# Patient Record
Sex: Male | Born: 1970 | Race: Black or African American | Hispanic: No | Marital: Married | Smoking: Never smoker
Health system: Southern US, Community
[De-identification: ages and names within clinical notes are randomized; demographics above are authoritative.]

---

## 2017-04-10 ENCOUNTER — Ambulatory Visit (HOSPITAL_COMMUNITY)
Admission: EM | Admit: 2017-04-10 | Discharge: 2017-04-10 | Disposition: A | Discharge status: Nursing Home (Includes ICF) | Payer: TRICARE For Life (TFL) | Attending: Internal Medicine | Admitting: Internal Medicine

## 2017-04-10 ENCOUNTER — Encounter (HOSPITAL_COMMUNITY): Payer: Self-pay | Admitting: *Deleted

## 2017-04-10 ENCOUNTER — Emergency Department (HOSPITAL_COMMUNITY)
Admission: EM | Admit: 2017-04-10 | Discharge: 2017-04-10 | Disposition: A | Discharge status: Home / Self Care | Attending: Emergency Medicine | Admitting: Emergency Medicine

## 2017-04-10 ENCOUNTER — Encounter (HOSPITAL_COMMUNITY): Payer: Self-pay | Admitting: Emergency Medicine

## 2017-04-10 ENCOUNTER — Emergency Department (HOSPITAL_COMMUNITY)

## 2017-04-10 DIAGNOSIS — W540XXA Bitten by dog, initial encounter: Secondary | ICD-10-CM | POA: Insufficient documentation

## 2017-04-10 DIAGNOSIS — Y998 Other external cause status: Secondary | ICD-10-CM | POA: Insufficient documentation

## 2017-04-10 DIAGNOSIS — Y92009 Unspecified place in unspecified non-institutional (private) residence as the place of occurrence of the external cause: Secondary | ICD-10-CM | POA: Insufficient documentation

## 2017-04-10 DIAGNOSIS — S71151A Open bite, right thigh, initial encounter: Secondary | ICD-10-CM

## 2017-04-10 DIAGNOSIS — Y9389 Activity, other specified: Secondary | ICD-10-CM | POA: Diagnosis not present

## 2017-04-10 DIAGNOSIS — S81851A Open bite, right lower leg, initial encounter: Secondary | ICD-10-CM

## 2017-04-10 DIAGNOSIS — Z23 Encounter for immunization: Secondary | ICD-10-CM | POA: Diagnosis not present

## 2017-04-10 DIAGNOSIS — S0181XA Laceration without foreign body of other part of head, initial encounter: Secondary | ICD-10-CM | POA: Diagnosis present

## 2017-04-10 DIAGNOSIS — S0185XA Open bite of other part of head, initial encounter: Secondary | ICD-10-CM

## 2017-04-10 DIAGNOSIS — S81812A Laceration without foreign body, left lower leg, initial encounter: Secondary | ICD-10-CM | POA: Insufficient documentation

## 2017-04-10 DIAGNOSIS — S81852A Open bite, left lower leg, initial encounter: Secondary | ICD-10-CM

## 2017-04-10 DIAGNOSIS — S41151A Open bite of right upper arm, initial encounter: Secondary | ICD-10-CM

## 2017-04-10 DIAGNOSIS — S41152A Open bite of left upper arm, initial encounter: Secondary | ICD-10-CM

## 2017-04-10 MED ORDER — HYDROCODONE-ACETAMINOPHEN 5-325 MG PO TABS
ORAL_TABLET | ORAL | Status: AC
  Filled 2017-04-10: qty 1

## 2017-04-10 MED ORDER — CHLORHEXIDINE GLUCONATE 4 % EX LIQD
4.0000 "application " | Freq: Once | CUTANEOUS | Status: AC
  Administered 2017-04-10: 4 via TOPICAL
  Filled 2017-04-10: qty 60

## 2017-04-10 MED ORDER — SODIUM CHLORIDE 0.9 % IV SOLN
3.0000 g | Freq: Once | INTRAVENOUS | Status: AC
  Administered 2017-04-10: 3 g via INTRAVENOUS
  Filled 2017-04-10: qty 3

## 2017-04-10 MED ORDER — TETANUS-DIPHTH-ACELL PERTUSSIS 5-2.5-18.5 LF-MCG/0.5 IM SUSP
0.5000 mL | Freq: Once | INTRAMUSCULAR | Status: AC
  Administered 2017-04-10: 0.5 mL via INTRAMUSCULAR
  Filled 2017-04-10: qty 0.5

## 2017-04-10 MED ORDER — HYDROCODONE-ACETAMINOPHEN 5-325 MG PO TABS
1.0000 | ORAL_TABLET | Freq: Once | ORAL | Status: AC
  Administered 2017-04-10: 1 via ORAL

## 2017-04-10 MED ORDER — LIDOCAINE HCL 2 % IJ SOLN
20.0000 mL | Freq: Once | INTRAMUSCULAR | Status: AC
  Administered 2017-04-10: 400 mg
  Filled 2017-04-10: qty 20

## 2017-04-10 MED ORDER — AMOXICILLIN-POT CLAVULANATE 875-125 MG PO TABS: 1.0000 | ORAL_TABLET | Freq: Two times a day (BID) | ORAL | 0 refills | Status: AC

## 2017-04-10 NOTE — ED Notes (Signed)
Pt taken to decon room and RUE, LUE, RLE, LLE wounds washed out with hibaclens and rinsed well. Pt tolerated well.

## 2017-04-10 NOTE — ED Provider Notes (Signed)
[facility]-EMERGENCY DEPT Provider Note   CSN: [id number] Arrival date & time: 04/10/17  1217     History   Chief Complaint Chief Complaint  Darren Campbell presents with  . Animal Bite    HPI Darren Campbell is a 46 y.o. male.  HPI   46 year old male presents status post animal bite.  Darren Campbell reports he is visiting a friend from [place].  He notes the friend has a boxer who normally is very friendly.  He reports he was attempting to keep the boxer in the house from going outside when he was attacked.  He notes bites to his bilateral lower extremities bilateral upper extremities and face.  He notes his last tetanus shot was over 5 years ago.  The owner of the dog is present throughout evaluation reports the dog's vaccinations are up-to-date, but they are watching the dog for the recommended quarantine.  No prior abnormal behavior prior to this event.  Darren Campbell was seen in urgent care, given hydrocodone and sent over the emergency room for further evaluation.   History reviewed. No pertinent past medical history.  There are no active problems to display for this Darren Campbell.   History reviewed. No pertinent surgical history.     Home Medications    Prior to Admission medications   Medication Sig Start Date End Date Taking? Authorizing Provider  amoxicillin-clavulanate (AUGMENTIN) 875-125 MG tablet Take 1 tablet by mouth every 12 (twelve) hours. 04/10/17   Eyvonne MechanicHedges, Shanard Treto, PA-C    Family History No family history on file.  Social History Social History  Substance Use Topics  . Smoking status: Never Smoker  . Smokeless tobacco: Never Used  . Alcohol use Yes    Allergies   Darren Campbell has no known allergies.   Review of Systems Review of Systems  All other systems reviewed and are negative.  Physical Exam Updated Vital Signs BP (!) 146/110 (BP Location: Right Arm)   Pulse 93   Temp 98.8 F (37.1 C) (Oral)   Resp 16   Ht 5\' 10"  (1.778 m)   Wt 74.8 kg (165 lb)   SpO2 100%   BMI  23.68 kg/m   Physical Exam  Constitutional: He is oriented to person, place, and time. He appears well-developed and well-nourished.  HENT:  Head: Normocephalic and atraumatic.  2 cm laceration / 1.5 cm laceration to left cheek  Eyes: Conjunctivae are normal. Pupils are equal, round, and reactive to light. Right eye exhibits no discharge. Left eye exhibits no discharge. No scleral icterus.  Neck: Normal range of motion. No JVD present. No tracheal deviation present.  Pulmonary/Chest: Effort normal. No stridor.  Musculoskeletal:  RUE- numerous punctures to left forearm, no foreign bodies  REE- superficial laceration to right forearm  RLE- punctures to left foot on both plantar and dorsal aspect  LLE- laceration of distal medial leg ( 1.5 cm)   Neurological: He is alert and oriented to person, place, and time. Coordination normal.  Psychiatric: He has a normal mood and affect. His behavior is normal. Judgment and thought content normal.  Nursing note and vitals reviewed.    ED Treatments / Results  Labs (all labs ordered are listed, but only abnormal results are displayed) Labs Reviewed - No data to display  EKG  EKG Interpretation None       Radiology Dg Forearm Left  Result Date: 04/10/2017 CLINICAL DATA:  Multiple dog bites today, initial encounter EXAM: LEFT FOREARM - 2 VIEW COMPARISON:  None. FINDINGS: Lucencies are noted in the  soft tissues consistent with the recent injury. No underlying bony abnormality is seen. IMPRESSION: Soft tissue injury without acute bony abnormality. Electronically Signed   By: Alcide Clever M.D.   On: 04/10/2017 15:09   Dg Foot Complete Right  Result Date: 04/10/2017 CLINICAL DATA:  Dog bite distal leg. EXAM: RIGHT FOOT COMPLETE - 3+ VIEW COMPARISON:  None. FINDINGS: Three views study shows a linear lucency through the base of the great toe proximal phalanx, with extension to the articular surface. This is visible on only 1 of the 3 images.  There does appear to be some gas in the soft tissues of this region. No retained radiopaque soft tissue foreign body. No other evidence of an acute fracture. No subluxation or dislocation. IMPRESSION: Findings suspicious for nondisplaced fracture involving the base of the great toe proximal phalanx. Gas in the overlying soft tissues may be related to puncture wound and raises the question of open injury. Electronically Signed   By: Kennith Center M.D.   On: 04/10/2017 15:10    Procedures .Marland KitchenLaceration Repair Date/Time: 04/10/2017 4:55 PM Performed by: Curlene Dolphin, Gunhild Bautch Authorized by: Curlene Dolphin, Manoah Deckard   Consent:    Consent obtained:  Verbal   Consent given by:  Darren Campbell   Risks discussed:  Infection, pain, poor cosmetic result and poor wound healing   Alternatives discussed:  No treatment, delayed treatment, observation and referral Anesthesia (see MAR for exact dosages):    Anesthesia method:  Local infiltration   Local anesthetic:  Lidocaine 1% w/o epi Laceration details:    Location:  Leg   Leg location:  L lower leg   Length (cm):  1.5 Repair type:    Repair type:  Simple Pre-procedure details:    Preparation:  Darren Campbell was prepped and draped in usual sterile fashion and imaging obtained to evaluate for foreign bodies Exploration:    Hemostasis achieved with:  Direct pressure   Wound exploration: wound explored through full range of motion and entire depth of wound probed and visualized     Wound extent: no fascia violation noted, no foreign bodies/material noted, no muscle damage noted, no nerve damage noted, no tendon damage noted, no underlying fracture noted and no vascular damage noted     Contaminated: no   Treatment:    Area cleansed with:  Saline, soap and water and Hibiclens   Amount of cleaning:  Extensive   Irrigation solution:  Sterile saline   Irrigation method:  Syringe   Visualized foreign bodies/material removed: no   Skin repair:    Repair method:  Sutures   Suture  size:  5-0   Suture material:  Prolene   Suture technique:  Simple interrupted   Number of sutures:  3 Approximation:    Approximation:  Loose   Vermilion border: well-aligned   Post-procedure details:    Dressing:  Bulky dressing   Darren Campbell tolerance of procedure:  Tolerated well, no immediate complications .Marland KitchenLaceration Repair Date/Time: 04/10/2017 4:56 PM Performed by: Curlene Dolphin, Calvina Liptak Authorized by: Curlene Dolphin, Menelik Mcfarren   Consent:    Consent obtained:  Verbal   Consent given by:  Darren Campbell   Risks discussed:  Infection, pain, poor cosmetic result, poor wound healing, nerve damage, retained foreign body, tendon damage, vascular damage and need for additional repair   Alternatives discussed:  No treatment, delayed treatment, observation and referral Anesthesia (see MAR for exact dosages):    Anesthesia method:  Local infiltration   Local anesthetic:  Lidocaine 1% w/o epi Laceration details:    Location:  Face   Length (cm):  2 Repair type:    Repair type:  Simple Pre-procedure details:    Preparation:  Darren Campbell was prepped and draped in usual sterile fashion Exploration:    Hemostasis achieved with:  Direct pressure   Wound exploration: entire depth of wound probed and visualized     Wound extent: no areolar tissue violation noted, no fascia violation noted, no foreign bodies/material noted, no nerve damage noted, no tendon damage noted, no underlying fracture noted and no vascular damage noted     Contaminated: no   Treatment:    Area cleansed with:  Hibiclens and saline   Amount of cleaning:  Standard   Irrigation solution:  Sterile saline   Irrigation method:  Syringe   Visualized foreign bodies/material removed: no   Skin repair:    Repair method:  Sutures Approximation:    Approximation:  Close   Vermilion border: well-aligned   Post-procedure details:    Dressing:  Open (no dressing)   Darren Campbell tolerance of procedure:  Tolerated well, no immediate complications    (including critical care time)  Medications Ordered in ED Medications  chlorhexidine (HIBICLENS) 4 % liquid 4 application (4 application Topical Given 04/10/17 1422)  Tdap (BOOSTRIX) injection 0.5 mL (0.5 mLs Intramuscular Given 04/10/17 1421)  lidocaine (XYLOCAINE) 2 % (with pres) injection 400 mg (400 mg Infiltration Given 04/10/17 1422)  Ampicillin-Sulbactam (UNASYN) 3 g in sodium chloride 0.9 % 100 mL IVPB (0 g Intravenous Stopped 04/10/17 1556)     Initial Impression / Assessment and Plan / ED Course  I have reviewed the triage vital signs and the nursing notes.  Pertinent labs & imaging results that were available during my care of the Darren Campbell were reviewed by me and considered in my medical decision making (see chart for details).      Final Clinical Impressions(s) / ED Diagnoses   Final diagnoses:  Dog bite, initial encounter  Facial laceration, initial encounter  Laceration of left lower extremity, initial encounter    Labs: DG foot complete right, DG forearm left  Imaging: DG forearm left DG foot complete right  Consults:  Therapeutics: Chlorhexidine, Tdap, lidocaine, Unasyn  Discharge Meds:   Assessment/Plan: 45 year old healthy male presents today with numerous animal bites.  He has numerous punctures and lacerations.  Darren Campbell has lacerations to his face that were thoroughly irrigated and repaired here.  Darren Campbell also has punctures to the bilateral upper extremities with no significant repairable lacerations.  Darren Campbell has a laceration to his left lower leg that was amenable to I&D after copious irrigation.  Darren Campbell was placed under the sink with all wounds thoroughly irrigated with soap and water and Hibiclens.  After this I personally irrigated his wounds.  He had no signs of clinical infection.  Darren Campbell does have what appears to be a fracture on the toe, he has no overlying laceration but does have a puncture on the plantar aspect of the foot.  I discussed with the Darren Campbell  the high risk of infection and do not need for close follow-up as an outpatient.  Darren Campbell was given a dose of Unasyn here, he will be placed on Augmentin for home.  Darren Campbell will follow up with primary care for wound recheck and suture removal, extensively counseled him on return precautions.  Both him and his wife verbalized understanding and agreement to today's plan had no further questions or concerns at time discharge  Darren Campbell was bit by a dog with up-to-date vaccinations that will be quarantined,  no need for rabies therapy at this time.      New Prescriptions Discharge Medication List as of 04/10/2017  4:59 PM    START taking these medications   Details  amoxicillin-clavulanate (AUGMENTIN) 875-125 MG tablet Take 1 tablet by mouth every 12 (twelve) hours., Starting Tue 04/10/2017, Print         Amardeep Beckers, [place], PA-C 04/10/17 1743    Mancel Bale, MD 04/11/17 [phone]

## 2017-04-10 NOTE — ED Notes (Signed)
Pt. Transported to xray 

## 2017-04-10 NOTE — Discharge Instructions (Signed)
Please read attached information.  Please keep wounds clean and bandaged as directed.  Please take antibiotics as indicated.  Please monitor for signs of infection follow-up immediately in the emergency room if any signs of infection present.  Please follow-up with your primary care provider in 1 week for suture removal.  Please return if any new or worsening signs or symptoms present.

## 2017-04-10 NOTE — ED Provider Notes (Signed)
CSN: [id number]     Arrival date & time 04/10/17  1100 History   None    Chief Complaint  Patient presents with  . Animal Bite   (Consider location/radiation/quality/duration/timing/severity/associated sxs/prior Treatment) Patient c/o multiple dog bites on face, arms, legs, and right thigh.  He is in severe pain.   The history is provided by the patient.  Animal Bite  Contact animal:  Dog Location:  Head/neck, leg and shoulder/arm Shoulder/arm injury location:  L forearm and R forearm Leg injury location:  L lower leg, R lower leg and R upper leg Time since incident:  1 hour Pain details:    Quality:  Aching   Severity:  Severe   Timing:  Constant   Progression:  Worsening Incident location:  Another residence Provoked: provoked   Notifications:  None Animal's rabies vaccination status:  Up to date Animal in possession: yes   Tetanus status:  Out of date Relieved by:  None tried Worsened by:  Activity Ineffective treatments:  None tried   History reviewed. No pertinent past medical history. History reviewed. No pertinent surgical history. History reviewed. No pertinent family history. Social History  Substance Use Topics  . Smoking status: Never Smoker  . Smokeless tobacco: Never Used  . Alcohol use Yes    Review of Systems  Constitutional: Negative.   HENT: Negative.   Eyes: Negative.   Respiratory: Negative.   Cardiovascular: Negative.   Gastrointestinal: Negative.   Endocrine: Negative.   Genitourinary: Negative.   Musculoskeletal: Positive for arthralgias.  Skin: Positive for wound.  Allergic/Immunologic: Negative.   Neurological: Negative.   Hematological: Negative.   Psychiatric/Behavioral: Negative.     Allergies  Patient has no known allergies.  Home Medications   Prior to Admission medications   Not on File   Meds Ordered and Administered this Visit   Medications  HYDROcodone-acetaminophen (NORCO/VICODIN) 5-325 MG per tablet 1 tablet (1  tablet Oral Given 04/10/17 1210)    BP 112/80 (BP Location: Right Arm)   Pulse 95   Temp 98.1 F (36.7 C) (Oral)   Resp 18  No data found.   Physical Exam  Constitutional: He appears well-developed and well-nourished.  HENT:  Head: Normocephalic and atraumatic.  Eyes: Conjunctivae and EOM are normal. Pupils are equal, round, and reactive to light.  Neck: Normal range of motion. Neck supple.  Cardiovascular: Normal rate, regular rhythm and normal heart sounds.   Pulmonary/Chest: Effort normal and breath sounds normal.  Skin: There is erythema.  Left cheek with gaping laceration, left arm with puncture wound, right arm with puncture wound, right foot with puncture wound, left leg and foot with puncture wound Right thigh with puncture wound.  Nursing note and vitals reviewed.   Urgent Care Course     Procedures (including critical care time)  Labs Review Labs Reviewed - No data to display  Imaging Review No results found.   Visual Acuity Review  Right Eye Distance:   Left Eye Distance:   Bilateral Distance:    Right Eye Near:   Left Eye Near:    Bilateral Near:         MDM   1. Dog bite of face, initial encounter   2. Dog bite of left lower leg, initial encounter   3. Dog bite of right thigh, initial encounter   4. Dog bite of right upper extremity, initial encounter   5. Dog bite of left upper extremity, initial encounter   6. Dog bite of right lower  leg, initial encounter    Norco 5/325 one po now Patient advised to go to ED for higher level of care.      Deatra Canter[place], Taesha Goodell J, FNP 04/10/17 1231

## 2017-04-10 NOTE — Discharge Instructions (Signed)
Please go to ED

## 2017-04-10 NOTE — ED Triage Notes (Signed)
Bitten   By   A  Friends   Boxer   Pt    Blocked   The  Animals   Exit     From   House      Dog  Has  Had  Rabies  Vaccine       Pt  Has  Multiple  Bites

## 2017-04-10 NOTE — ED Triage Notes (Signed)
Pt. Stated, visiting his friend and the dog turned on him when letting hm out. Bite to face, both arms, right foot, and left leg. Dog has had his shots.

## 2018-03-04 IMAGING — DX DG FOOT COMPLETE 3+V*R*
3 series · 3 of 3 positions shown · non-contrast
Comparison: None.

CLINICAL DATA: Dog bite distal leg.

EXAM:
RIGHT FOOT COMPLETE - 3+ VIEW

[foot ap]
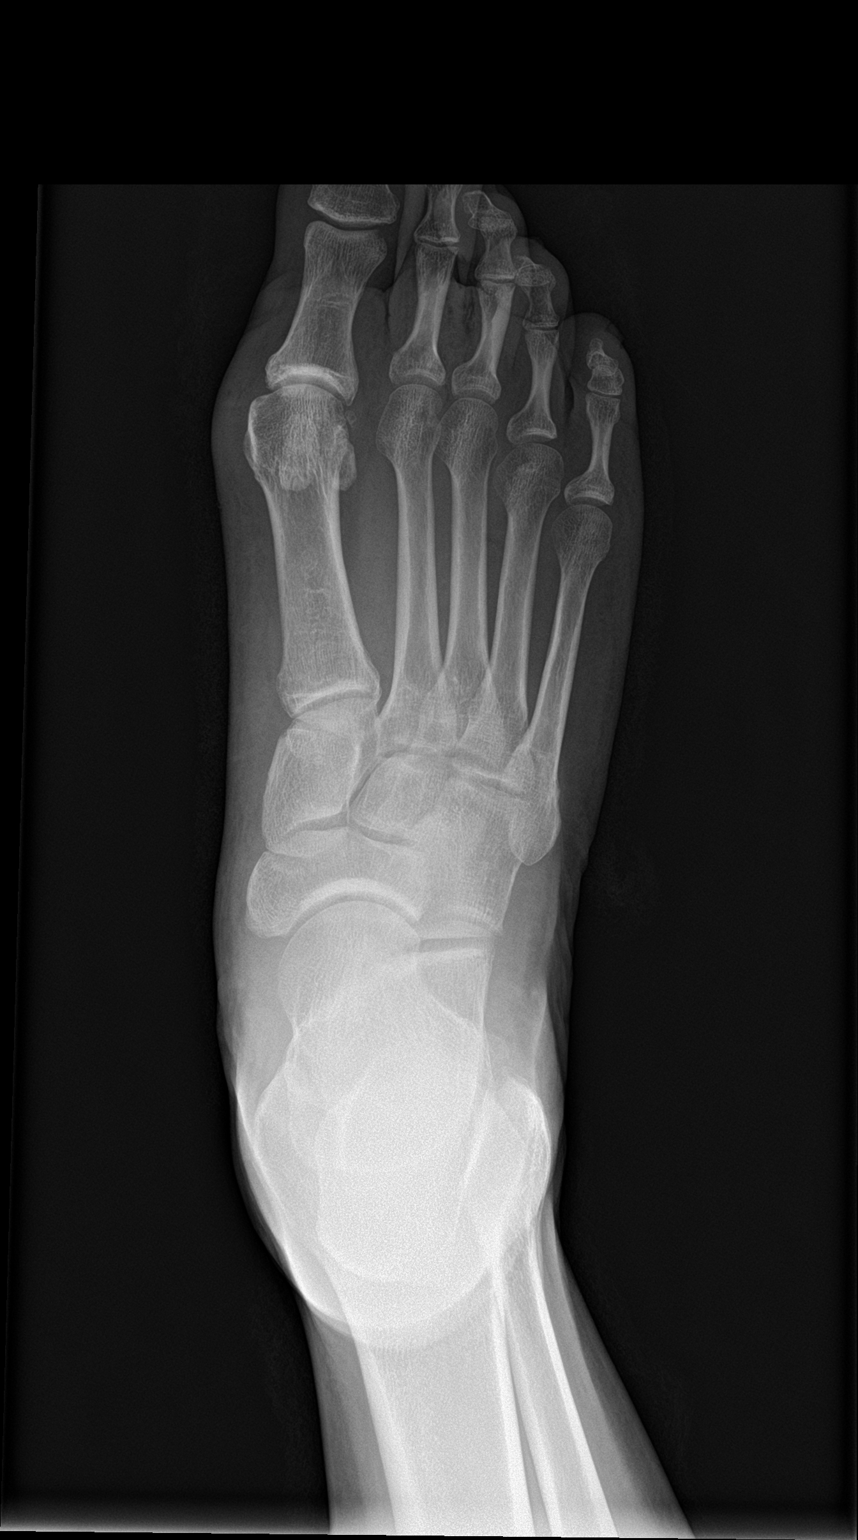

[foot obl]
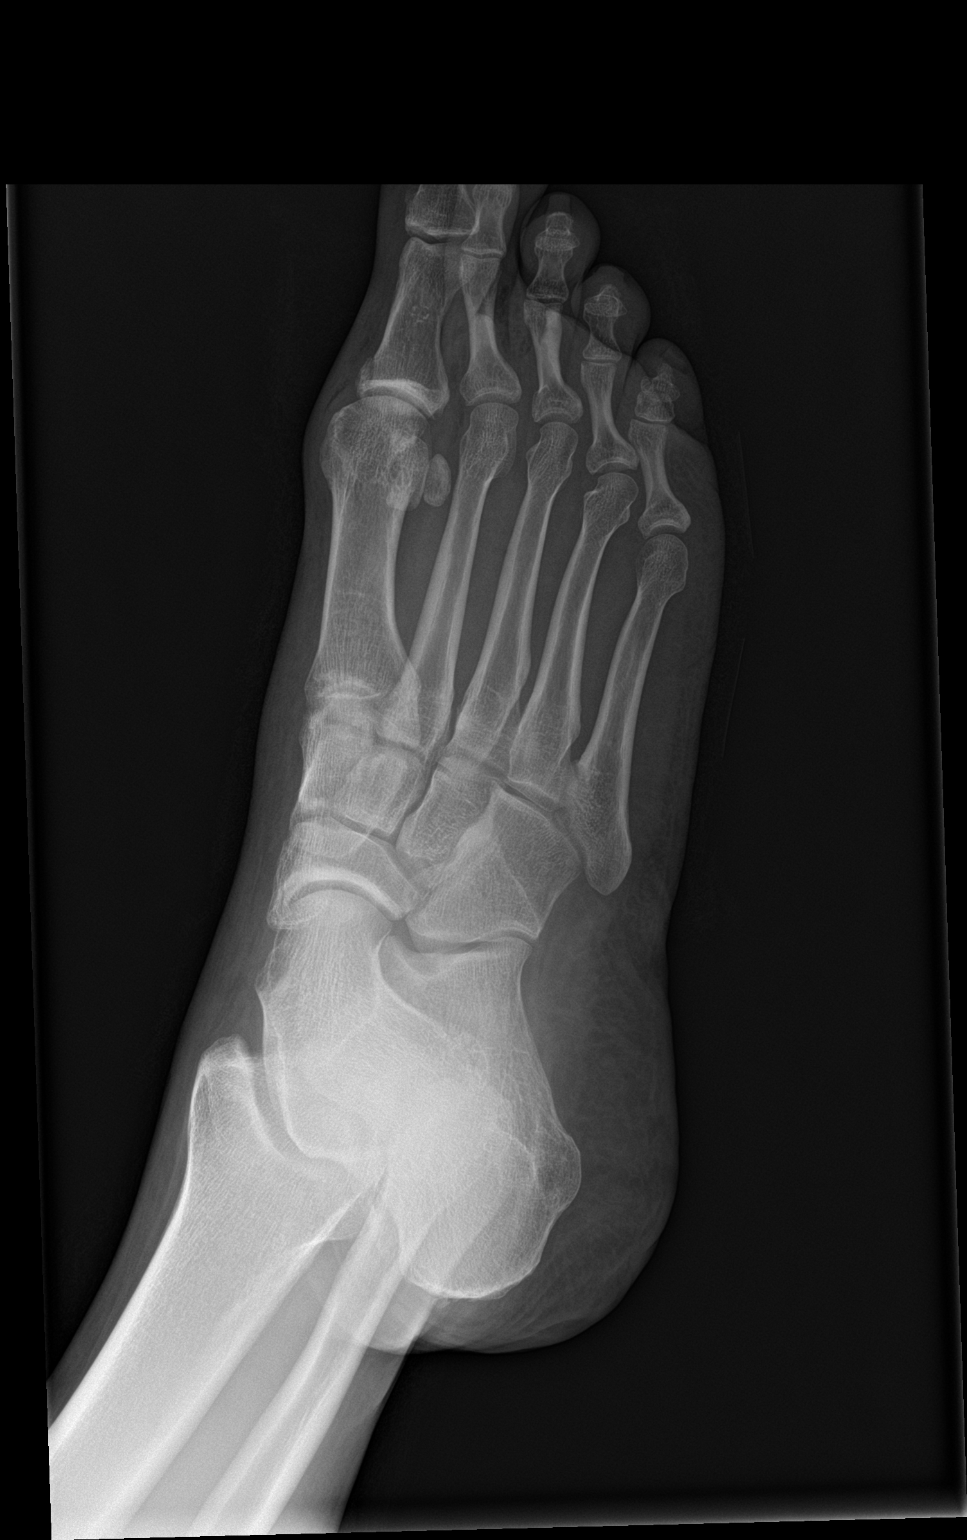

[foot lat]
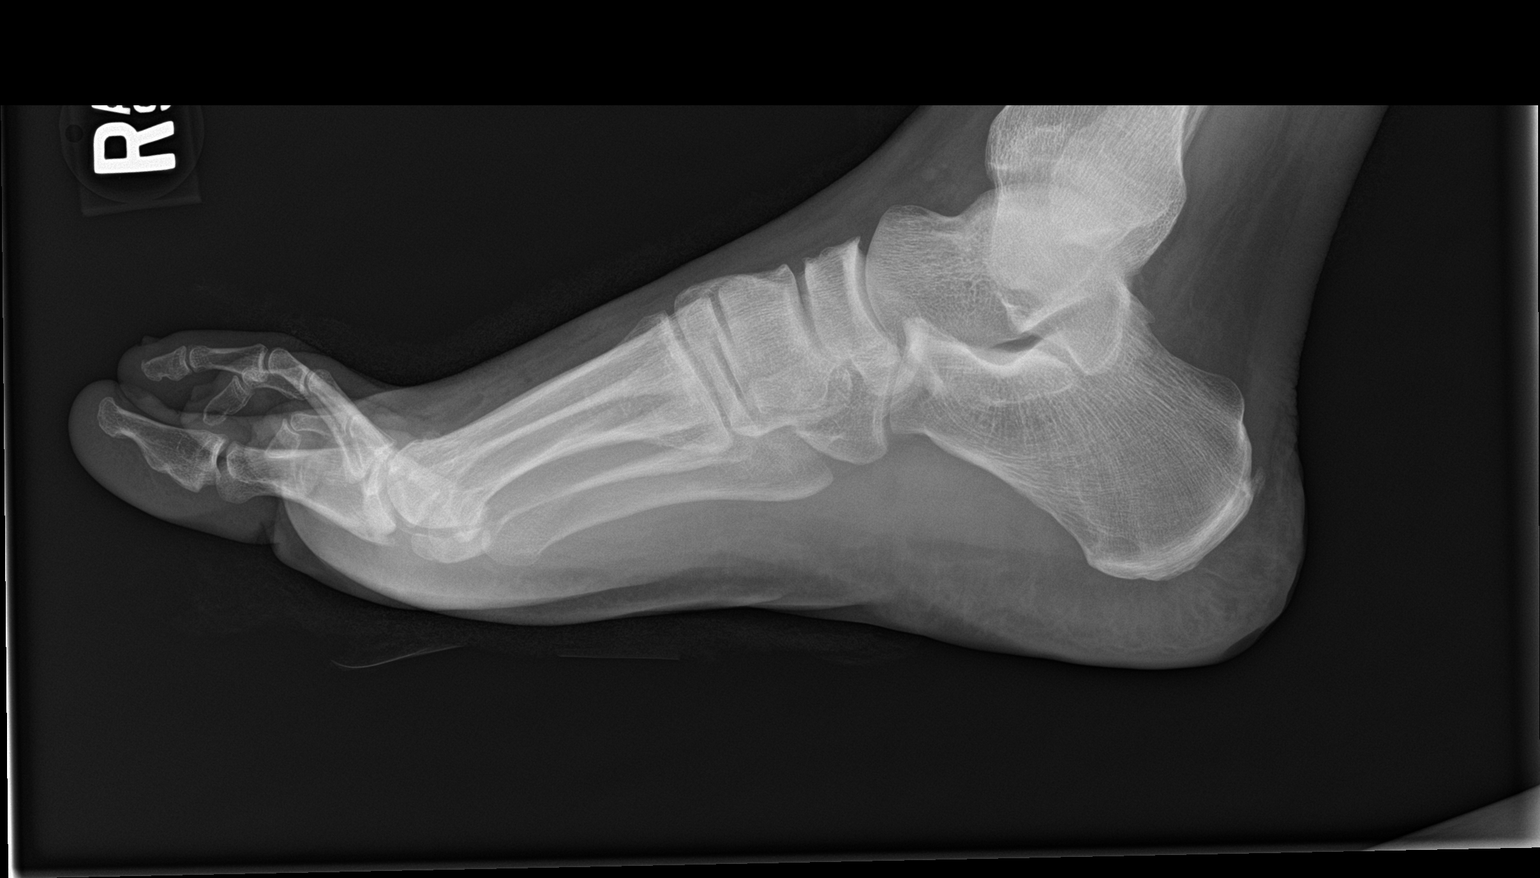

[3 of 3 positions shown; findings below may reference images not displayed]

FINDINGS: Three views study shows a linear lucency through the base of the
great toe proximal phalanx, with extension to the articular surface.
This is visible on only 1 of the 3 images. There does appear to be
some gas in the soft tissues of this region. No retained radiopaque
soft tissue foreign body. No other evidence of an acute fracture. No
subluxation or dislocation.
IMPRESSION: Findings suspicious for nondisplaced fracture involving the base of
the great toe proximal phalanx. Gas in the overlying soft tissues
may be related to puncture wound and raises the question of open
injury.

## 2018-03-04 IMAGING — DX DG FOREARM 2V*L*
2 series · 2 of 2 positions shown · non-contrast
Comparison: None.

CLINICAL DATA: Multiple dog bites today, initial encounter

EXAM:
LEFT FOREARM - 2 VIEW

[forearm ap]
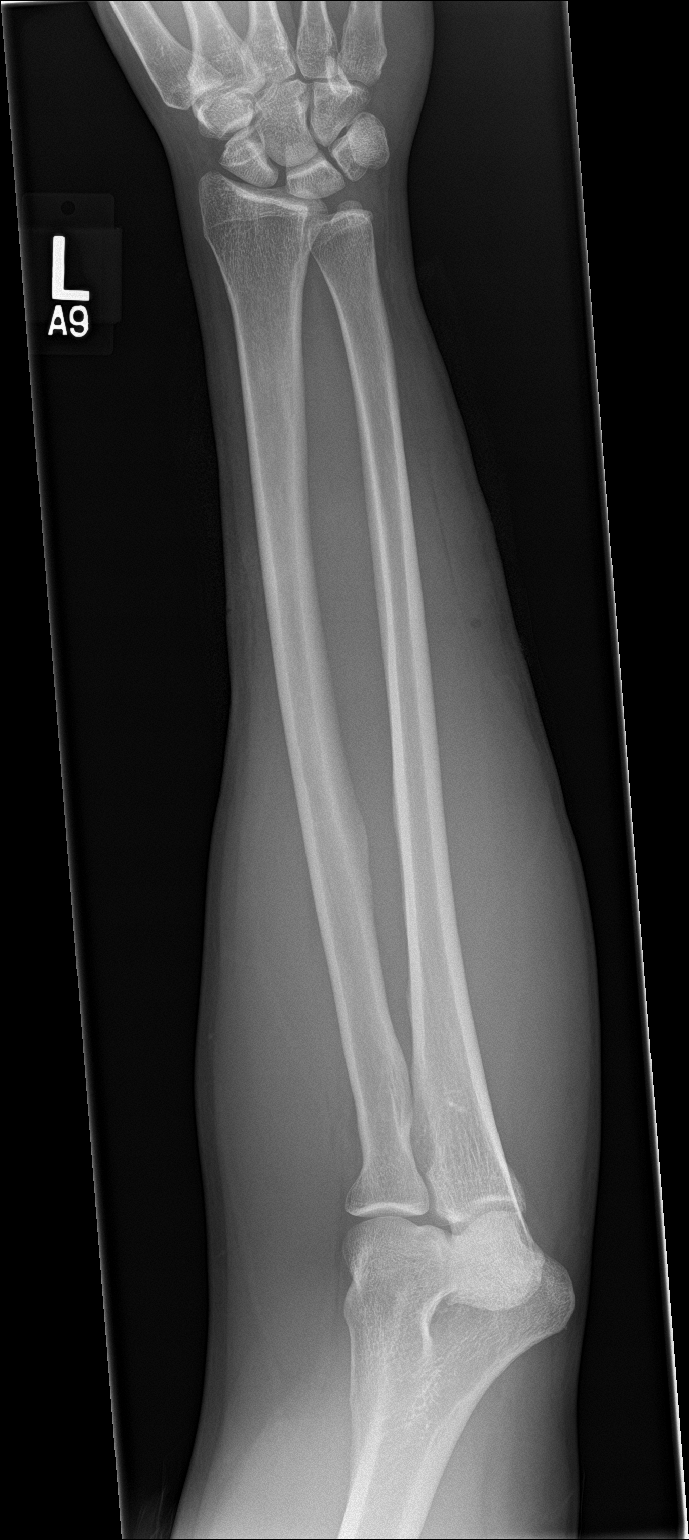

[forearm lat]
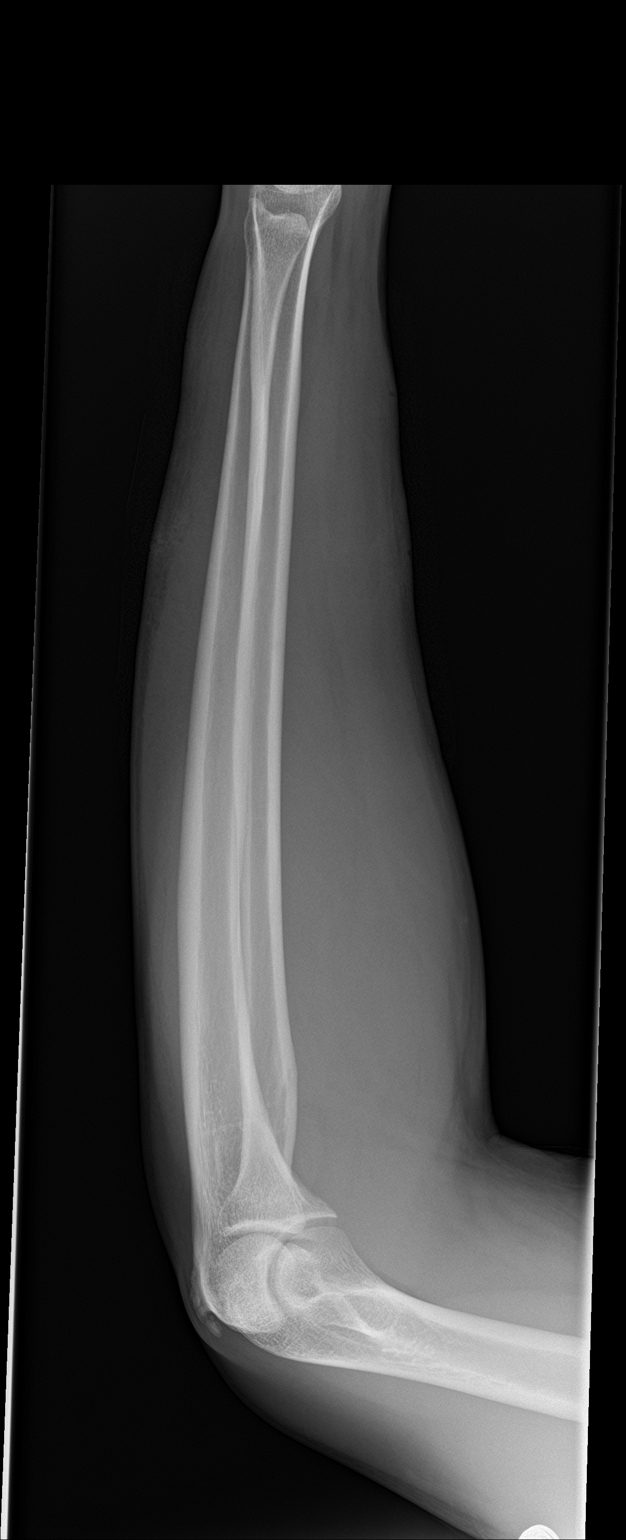

[2 of 2 positions shown; findings below may reference images not displayed]

FINDINGS: Lucencies are noted in the soft tissues consistent with the recent
injury. No underlying bony abnormality is seen.
IMPRESSION: Soft tissue injury without acute bony abnormality.
